# Patient Record
Sex: Male | Born: 2000 | Race: Black or African American | Hispanic: No | Marital: Single | Smoking: Never smoker
Health system: Southern US, Community
[De-identification: ages and names within clinical notes are randomized; demographics above are authoritative.]

## PROBLEM LIST (undated history)

## (undated) DIAGNOSIS — J45909 Unspecified asthma, uncomplicated: Secondary | ICD-10-CM

## (undated) DIAGNOSIS — I10 Essential (primary) hypertension: Secondary | ICD-10-CM

## (undated) DIAGNOSIS — D571 Sickle-cell disease without crisis: Secondary | ICD-10-CM

## (undated) DIAGNOSIS — M87059 Idiopathic aseptic necrosis of unspecified femur: Secondary | ICD-10-CM

## (undated) HISTORY — PX: HIP SURGERY: SHX245

## (undated) HISTORY — PX: SPLENECTOMY: SUR1306

## (undated) HISTORY — PX: SPLENECTOMY, TOTAL: SHX788

---
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
## 2019-01-07 ENCOUNTER — Encounter (HOSPITAL_BASED_OUTPATIENT_CLINIC_OR_DEPARTMENT_OTHER): Payer: Self-pay | Admitting: *Deleted

## 2019-01-07 ENCOUNTER — Other Ambulatory Visit: Payer: Self-pay

## 2019-01-07 ENCOUNTER — Emergency Department (HOSPITAL_BASED_OUTPATIENT_CLINIC_OR_DEPARTMENT_OTHER)
Admission: EM | Admit: 2019-01-07 | Discharge: 2019-01-07 | Disposition: A | Discharge status: Home / Self Care | Payer: BC Managed Care – PPO | Attending: Emergency Medicine | Admitting: Emergency Medicine

## 2019-01-07 DIAGNOSIS — J45909 Unspecified asthma, uncomplicated: Secondary | ICD-10-CM | POA: Diagnosis not present

## 2019-01-07 DIAGNOSIS — Z7722 Contact with and (suspected) exposure to environmental tobacco smoke (acute) (chronic): Secondary | ICD-10-CM | POA: Diagnosis not present

## 2019-01-07 DIAGNOSIS — M25569 Pain in unspecified knee: Secondary | ICD-10-CM | POA: Diagnosis present

## 2019-01-07 DIAGNOSIS — D5700 Hb-SS disease with crisis, unspecified: Secondary | ICD-10-CM | POA: Diagnosis not present

## 2019-01-07 DIAGNOSIS — I10 Essential (primary) hypertension: Secondary | ICD-10-CM | POA: Insufficient documentation

## 2019-01-07 LAB — CBC
HCT: 30.6 % — ABNORMAL LOW (ref 39.0–52.0)
Hemoglobin: 10.3 g/dL — ABNORMAL LOW (ref 13.0–17.0)
MCH: 24.5 pg — ABNORMAL LOW (ref 26.0–34.0)
MCHC: 33.7 g/dL (ref 30.0–36.0)
MCV: 72.9 fL — ABNORMAL LOW (ref 80.0–100.0)
Platelets: 554 10*3/uL — ABNORMAL HIGH (ref 150–400)
RBC: 4.2 MIL/uL — ABNORMAL LOW (ref 4.22–5.81)
RDW: 23.6 % — ABNORMAL HIGH (ref 11.5–15.5)
WBC: 10.4 10*3/uL (ref 4.0–10.5)
nRBC: 0.6 % — ABNORMAL HIGH (ref 0.0–0.2)

## 2019-01-07 LAB — RETICULOCYTES
Immature Retic Fract: 36.7 % — ABNORMAL HIGH (ref 2.3–15.9)
RBC.: 4.12 MIL/uL — ABNORMAL LOW (ref 4.22–5.81)
Retic Count, Absolute: 265.5 10*3/uL — ABNORMAL HIGH (ref 19.0–186.0)
Retic Ct Pct: 6.1 % — ABNORMAL HIGH (ref 0.4–3.1)

## 2019-01-07 LAB — COMPREHENSIVE METABOLIC PANEL
ALT: 13 U/L (ref 0–44)
AST: 21 U/L (ref 15–41)
Albumin: 4 g/dL (ref 3.5–5.0)
Alkaline Phosphatase: 82 U/L (ref 38–126)
Anion gap: 8 (ref 5–15)
BUN: 10 mg/dL (ref 6–20)
CO2: 24 mmol/L (ref 22–32)
Calcium: 8.8 mg/dL — ABNORMAL LOW (ref 8.9–10.3)
Chloride: 104 mmol/L (ref 98–111)
Creatinine, Ser: 0.57 mg/dL — ABNORMAL LOW (ref 0.61–1.24)
GFR calc Af Amer: >60 mL/min (ref >60)
GFR calc non Af Amer: >60 mL/min (ref >60)
Glucose, Bld: 95 mg/dL (ref 70–99)
Potassium: 4 mmol/L (ref 3.5–5.1)
Sodium: 136 mmol/L (ref 135–145)
Total Bilirubin: 1.7 mg/dL — ABNORMAL HIGH (ref 0.3–1.2)
Total Protein: 7.5 g/dL (ref 6.5–8.1)

## 2019-01-07 MED ORDER — KETOROLAC TROMETHAMINE 15 MG/ML IJ SOLN
15.0000 mg | Freq: Once | INTRAMUSCULAR | Status: AC
  Administered 2019-01-07: 15:00:00 15 mg via INTRAVENOUS
  Filled 2019-01-07: qty 1

## 2019-01-07 MED ORDER — HYDROMORPHONE HCL 1 MG/ML IJ SOLN
1.0000 mg | Freq: Once | INTRAMUSCULAR | Status: AC
  Administered 2019-01-07: 13:00:00 1 mg via INTRAVENOUS
  Filled 2019-01-07: qty 1

## 2019-01-07 MED ORDER — HYDROMORPHONE HCL 1 MG/ML IJ SOLN
1.0000 mg | Freq: Once | INTRAMUSCULAR | Status: AC
  Administered 2019-01-07: 15:00:00 1 mg via INTRAVENOUS
  Filled 2019-01-07: qty 1

## 2019-01-07 MED ORDER — HYDROMORPHONE HCL 1 MG/ML IJ SOLN
1.0000 mg | Freq: Once | INTRAMUSCULAR | Status: AC
  Administered 2019-01-07: 1 mg via INTRAVENOUS
  Filled 2019-01-07: qty 1

## 2019-01-07 MED ORDER — SODIUM CHLORIDE 0.9 % IV BOLUS
1000.0000 mL | Freq: Once | INTRAVENOUS | Status: AC
  Administered 2019-01-07: 1000 mL via INTRAVENOUS

## 2019-01-07 NOTE — ED Triage Notes (Signed)
Left knee pain since yesterday. States he is having sickle cell pain. He went to his MD yesterday and left without being seen. States he did not feel his MD was giving him proper care. He is ambulatory.

## 2019-01-07 NOTE — ED Provider Notes (Signed)
[facility] Emergency Department Provider Note MRN:  [id number]  Arrival date & time: 01/07/19     Chief Complaint   Knee Pain and Sickle Cell Pain Crisis   History of Present Illness   Mark Blake is a 18 y.o. year-old male with a history of sickle cell disease presenting to the ED with chief complaint of sickle cell pain crisis.  Pain began yesterday at 1:30 AM.  Sudden onset, constant since that time.  Pain is 8 out of 10 in severity.  No trauma, somewhat worsened with motion.  Denies fever, no rash, no chest pain, shortness of breath, no abdominal pain.  Seems to have a flare of his sickle cell disease every October with the change of the weather.  Review of Systems  A complete 10 system review of systems was obtained and all systems are negative except as noted in the HPI and PMH.   Patient's Health History    Past Medical History:  Diagnosis Date  . Asthma   . Avascular necrosis of hip ([place])   . Hypertension   . Sickle cell anemia (HCC)     Past Surgical History:  Procedure Laterality Date  . HIP SURGERY    . SPLENECTOMY      Family History  Problem Relation Age of Onset  . Asthma Mother   . Eczema Mother   . Lupus Mother   . Migraines Mother   . Asthma Father   . Eczema Father   . Asthma Brother   . Allergic rhinitis Neg Hx   . Urticaria Neg Hx   . Immunodeficiency Neg Hx   . Atopy Neg Hx   . Angioedema Neg Hx     Social History   Socioeconomic History  . Marital status: Single    Spouse name: Not on file  . Number of children: Not on file  . Years of education: Not on file  . Highest education level: Not on file  Occupational History  . Not on file  Social Needs  . Financial resource strain: Not on file  . Food insecurity    Worry: Not on file    Inability: Not on file  . Transportation needs    Medical: Not on file    Non-medical: Not on file  Tobacco Use  . Smoking status: Passive Smoke Exposure - Never  Smoker  . Smokeless tobacco: Never Used  Substance and Sexual Activity  . Alcohol use: No  . Drug use: No  . Sexual activity: Not on file  Lifestyle  . Physical activity    Days per week: Not on file    Minutes per session: Not on file  . Stress: Not on file  Relationships  . Social Herbalist on phone: Not on file    Gets together: Not on file    Attends religious service: Not on file    Active member of club or organization: Not on file    Attends meetings of clubs or organizations: Not on file    Relationship status: Not on file  . Intimate partner violence    Fear of current or ex partner: Not on file    Emotionally abused: Not on file    Physically abused: Not on file    Forced sexual activity: Not on file  Other Topics Concern  . Not on file  Social History Narrative  . Not on file     Physical Exam  Vital Signs and  Nursing Notes reviewed Vitals:   01/07/19 1315 01/07/19 1330  BP: 127/74 137/78  Pulse: 71 82  Resp: 14 17  Temp:    SpO2: 95% 97%    CONSTITUTIONAL: Well-appearing, NAD NEURO:  Alert and oriented x 3, no focal deficits EYES:  eyes equal and reactive ENT/NECK:  no LAD, no JVD CARDIO: Regular rate, well-perfused, normal S1 and S2 PULM:  CTAB no wheezing or rhonchi GI/GU:  normal bowel sounds, non-distended, non-tender MSK/SPINE:  No gross deformities, no edema, normal range of motion of the bilateral knees, no edema, no erythema, no increased warmth to the left knee SKIN:  no rash, atraumatic PSYCH:  Appropriate speech and behavior  Diagnostic and Interventional Summary    Labs Reviewed  CBC - Abnormal; Notable for the following components:      Result Value   RBC 4.20 (*)    Hemoglobin 10.3 (*)    HCT 30.6 (*)    MCV 72.9 (*)    MCH 24.5 (*)    RDW 23.6 (*)    Platelets 554 (*)    nRBC 0.6 (*)    All other components within normal limits  COMPREHENSIVE METABOLIC PANEL - Abnormal; Notable for the following components:    Creatinine, Ser 0.57 (*)    Calcium 8.8 (*)    Total Bilirubin 1.7 (*)    All other components within normal limits  RETICULOCYTES    No orders to display    Medications  HYDROmorphone (DILAUDID) injection 1 mg (has no administration in time range)  ketorolac (TORADOL) 15 MG/ML injection 15 mg (has no administration in time range)  HYDROmorphone (DILAUDID) injection 1 mg (1 mg Intravenous Given 01/07/19 1326)  sodium chloride 0.9 % bolus 1,000 mL ( Intravenous Stopped 01/07/19 1439)  HYDROmorphone (DILAUDID) injection 1 mg (1 mg Intravenous Given 01/07/19 1440)     Procedures Critical Care  ED Course and Medical Decision Making  I have reviewed the triage vital signs and the nursing notes.  Pertinent labs & imaging results that were available during my care of the patient were reviewed by me and considered in my medical decision making (see below for details).  Favoring uncomplicated vaso-occlusive crisis related to sickle cell disease.  No evidence of DVT, nothing to suggest infection.  Will obtain basic labs, reticulocyte count, 10 pain control, anticipating discharge.  Patient with 7 out of 10 pain after the first dose of Dilaudid, down to 5 out of 10 pain after the second dose of Dilaudid.  Will provide a third dose as well as Toradol and reassess hopeful for discharge with close hematology follow-up.  Signed out to Dr. Jacqulyn BathLong at shift change.  Elmer SowMichael M. Pilar PlateBero, MD [facility] [email or web address]  Final Clinical Impressions(s) / ED Diagnoses     ICD-10-CM   1. Sickle cell pain crisis [facility](HCC)  D57.00     ED Discharge Orders    None      Discharge Instructions Discussed with and Provided to Patient:   Discharge Instructions     You were evaluated in the Emergency Department and after careful evaluation, we did not find any emergent condition requiring admission or further testing in the hospital.  Your exam/testing today is  overall reassuring.  Please return to the Emergency Department if you experience any worsening of your condition.  We encourage you to follow up with a primary care provider and your hematologist.  Thank you for allowing us to be a part of  your care.       Sabas Sous, MD 01/07/19 [phone]

## 2019-01-07 NOTE — ED Provider Notes (Signed)
Blood pressure (!) 142/79, pulse 93, temperature 98.3 F (36.8 C), temperature source Oral, resp. rate 19, height 5\' 9"  (1.753 m), weight 56.7 kg, SpO2 96 %.  Assuming care from Dr. Sedonia Small.  In short, Mark Blake is a 18 y.o. male with a chief complaint of Knee Pain and Sickle Cell Pain Crisis .  Refer to the original H&P for additional details.  The current plan of care is to f/u on pain control after additional meds.  03:51 PM Patient's pain is further downtrending at 3/10.  Plan for home medications, oral hydration at home, close sickle cell follow-up.  Discussed ED return precautions.    Margette Fast, MD 01/07/19 [phone]

## 2019-01-07 NOTE — Discharge Instructions (Signed)
You were evaluated in the Emergency Department and after careful evaluation, we did not find any emergent condition requiring admission or further testing in the hospital.  Your exam/testing today is overall reassuring.  Please return to the Emergency Department if you experience any worsening of your condition.  We encourage you to follow up with a primary care provider and your hematologist.  Thank you for allowing [place] to be a part of your care.

## 2019-02-18 ENCOUNTER — Emergency Department (HOSPITAL_COMMUNITY)
Admission: EM | Admit: 2019-02-18 | Discharge: 2019-02-18 | Disposition: A | Discharge status: Home / Self Care | Payer: BC Managed Care – PPO | Attending: Emergency Medicine | Admitting: Emergency Medicine

## 2019-02-18 ENCOUNTER — Encounter (HOSPITAL_COMMUNITY): Payer: Self-pay

## 2019-02-18 ENCOUNTER — Other Ambulatory Visit: Payer: Self-pay

## 2019-02-18 DIAGNOSIS — Z79899 Other long term (current) drug therapy: Secondary | ICD-10-CM | POA: Insufficient documentation

## 2019-02-18 DIAGNOSIS — Z7722 Contact with and (suspected) exposure to environmental tobacco smoke (acute) (chronic): Secondary | ICD-10-CM | POA: Insufficient documentation

## 2019-02-18 DIAGNOSIS — J45909 Unspecified asthma, uncomplicated: Secondary | ICD-10-CM | POA: Diagnosis not present

## 2019-02-18 DIAGNOSIS — D5700 Hb-SS disease with crisis, unspecified: Secondary | ICD-10-CM | POA: Diagnosis not present

## 2019-02-18 DIAGNOSIS — M25532 Pain in left wrist: Secondary | ICD-10-CM | POA: Diagnosis present

## 2019-02-18 DIAGNOSIS — I10 Essential (primary) hypertension: Secondary | ICD-10-CM | POA: Diagnosis not present

## 2019-02-18 LAB — CBC WITH DIFFERENTIAL/PLATELET
Abs Immature Granulocytes: 0.05 10*3/uL (ref 0.00–0.07)
Basophils Absolute: 0.1 10*3/uL (ref 0.0–0.1)
Basophils Relative: 1 %
Eosinophils Absolute: 1.3 10*3/uL — ABNORMAL HIGH (ref 0.0–0.5)
Eosinophils Relative: 12 %
HCT: 31.3 % — ABNORMAL LOW (ref 39.0–52.0)
Hemoglobin: 10.3 g/dL — ABNORMAL LOW (ref 13.0–17.0)
Immature Granulocytes: 1 %
Lymphocytes Relative: 30 %
Lymphs Abs: 3.1 10*3/uL (ref 0.7–4.0)
MCH: 23.3 pg — ABNORMAL LOW (ref 26.0–34.0)
MCHC: 32.9 g/dL (ref 30.0–36.0)
MCV: 70.7 fL — ABNORMAL LOW (ref 80.0–100.0)
Monocytes Absolute: 0.9 10*3/uL (ref 0.1–1.0)
Monocytes Relative: 8 %
Neutro Abs: 5.1 10*3/uL (ref 1.7–7.7)
Neutrophils Relative %: 48 %
Platelets: 623 10*3/uL — ABNORMAL HIGH (ref 150–400)
RBC: 4.43 MIL/uL (ref 4.22–5.81)
RDW: 23.4 % — ABNORMAL HIGH (ref 11.5–15.5)
WBC: 10.4 10*3/uL (ref 4.0–10.5)
nRBC: 0.4 % — ABNORMAL HIGH (ref 0.0–0.2)

## 2019-02-18 LAB — RETICULOCYTES
Immature Retic Fract: 29.8 % — ABNORMAL HIGH (ref 2.3–15.9)
RBC.: 4.43 MIL/uL (ref 4.22–5.81)
Retic Count, Absolute: 324 10*3/uL — ABNORMAL HIGH (ref 19.0–186.0)
Retic Ct Pct: 7.7 % — ABNORMAL HIGH (ref 0.4–3.1)

## 2019-02-18 LAB — COMPREHENSIVE METABOLIC PANEL
ALT: 28 U/L (ref 0–44)
AST: 27 U/L (ref 15–41)
Albumin: 4.1 g/dL (ref 3.5–5.0)
Alkaline Phosphatase: 87 U/L (ref 38–126)
Anion gap: 9 (ref 5–15)
BUN: 7 mg/dL (ref 6–20)
CO2: 25 mmol/L (ref 22–32)
Calcium: 8.9 mg/dL (ref 8.9–10.3)
Chloride: 103 mmol/L (ref 98–111)
Creatinine, Ser: 0.58 mg/dL — ABNORMAL LOW (ref 0.61–1.24)
GFR calc Af Amer: >60 mL/min (ref >60)
GFR calc non Af Amer: >60 mL/min (ref >60)
Glucose, Bld: 82 mg/dL (ref 70–99)
Potassium: 3.9 mmol/L (ref 3.5–5.1)
Sodium: 137 mmol/L (ref 135–145)
Total Bilirubin: 1.3 mg/dL — ABNORMAL HIGH (ref 0.3–1.2)
Total Protein: 8 g/dL (ref 6.5–8.1)

## 2019-02-18 MED ORDER — ONDANSETRON HCL 4 MG/2ML IJ SOLN
4.0000 mg | INTRAMUSCULAR | Status: DC | PRN
  Filled 2019-02-18: qty 2

## 2019-02-18 MED ORDER — HYDROMORPHONE HCL 1 MG/ML IJ SOLN
1.0000 mg | INTRAMUSCULAR | Status: AC
  Filled 2019-02-18: qty 1

## 2019-02-18 MED ORDER — HYDROMORPHONE HCL 1 MG/ML IJ SOLN: 0.5000 mg | INTRAMUSCULAR | Status: AC

## 2019-02-18 MED ORDER — HYDROMORPHONE HCL 1 MG/ML IJ SOLN: 1.0000 mg | INTRAMUSCULAR | Status: AC

## 2019-02-18 MED ORDER — DEXTROSE-NACL 5-0.45 % IV SOLN
INTRAVENOUS | Status: DC
  Administered 2019-02-18: 20:00:00 via INTRAVENOUS

## 2019-02-18 MED ORDER — KETOROLAC TROMETHAMINE 30 MG/ML IJ SOLN
30.0000 mg | INTRAMUSCULAR | Status: AC
  Administered 2019-02-18: 30 mg via INTRAVENOUS
  Filled 2019-02-18: qty 1

## 2019-02-18 MED ORDER — HYDROMORPHONE HCL 1 MG/ML IJ SOLN
0.5000 mg | INTRAMUSCULAR | Status: AC
  Administered 2019-02-18: 20:00:00 0.5 mg via INTRAVENOUS
  Filled 2019-02-18: qty 1

## 2019-02-18 MED ORDER — SODIUM CHLORIDE 0.9% FLUSH: 3.0000 mL | Freq: Once | INTRAVENOUS | Status: DC

## 2019-02-18 NOTE — ED Provider Notes (Signed)
[facility] Note   CSN: [id number] Arrival date & time: 02/18/19  1739     History   Chief Complaint Chief Complaint  Patient presents with  . Sickle Cell Pain Crisis    HPI Mark Blake is a 18 y.o. male past medical history significant for sickle cell anemia, asthma, splenic sequestration status post splenectomy, secondary hypertension, priapism presents to emergency department today with chief complaint of sickle cell pain crisis.  Pain is located in his left wrist.  He states this feels like his typical sickle cell pain.  He states pain started at 11 AM this morning.  He tried managing with his home medications however reports symptoms did not improve.  He reports weather changes typical trigger for his pain, consistent with recent onset cold weather.  He denies any fever, chest pain, shortness of breath, abdominal pain, URI symptoms, priapism. History provided by patient with additional history obtained from chart review.       Past Medical History:  Diagnosis Date  . Asthma   . Avascular necrosis of hip (HCC)   . Hypertension   . Sickle cell anemia [facility])     Patient Active Problem List   Diagnosis Date Noted  . Moderate persistent asthma 08/09/2016  . Seasonal and perennial allergic rhinitis 08/09/2016  . Allergic conjunctivitis 08/09/2016  . Atopic dermatitis 08/09/2016    Past Surgical History:  Procedure Laterality Date  . HIP SURGERY    . SPLENECTOMY          Home Medications    Prior to Admission medications   Medication Sig Start Date End Date Taking? Authorizing Provider  albuterol (PROVENTIL HFA;VENTOLIN HFA) 108 (90 Base) MCG/ACT inhaler Inhale 1-2 puffs into the lungs every 4 (four) hours as needed for wheezing or shortness of breath. 08/09/16  Yes Bobbitt, Heywood Iles, MD  cefadroxil (DURICEF) 500 MG capsule Take 500 mg by mouth 2 (two) times daily.   Yes [provider]  desonide (DESOWEN)  0.05 % ointment Apply sparingly twice daily as needed to affected areas on face or neck 08/09/16  Yes Bobbitt, Heywood Iles, MD  fluticasone [facility]) 50 MCG/ACT nasal spray Place 2 sprays into both nostrils daily as needed for allergies or rhinitis. 08/09/16  Yes Bobbitt, Heywood Iles, MD  fluticasone (FLOVENT HFA) 110 MCG/ACT inhaler Inhale 2 puffs into the lungs 2 (two) times daily. Use with spacer device. 08/09/16  Yes Bobbitt, Heywood Iles, MD  folic acid (FOLVITE) 1 MG tablet Take 1 mg by mouth daily.   Yes [provider]  hydroxyurea (HYDREA) 500 MG capsule Take 500 mg by mouth daily. May take with food to minimize GI side effects.   Yes [provider]  hydrOXYzine (ATARAX/VISTARIL) 25 MG tablet Take 25 mg by mouth every 6 (six) hours as needed for itching. 07/10/17  Yes [provider]  ibuprofen (ADVIL,MOTRIN) 400 MG tablet Take 400 mg by mouth every 6 (six) hours as needed.   Yes [provider]  levocetirizine (XYZAL) 5 MG tablet Take 1 tablet (5 mg total) by mouth daily as needed for allergies. 08/09/16  Yes Bobbitt, Heywood Iles, MD  lisinopril (PRINIVIL,ZESTRIL) 2.5 MG tablet Take 2.5 mg by mouth daily.    Yes [provider]  montelukast (SINGULAIR) 5 MG chewable tablet Chew 5 mg by mouth at bedtime.   Yes [provider]  Olopatadine HCl (PATADAY) 0.2 % SOLN Place 1 drop into both eyes daily as needed. 08/11/16  Yes  Marcelyn BruinsPadgett, Shaylar Patricia, MD  oxyCODONE (OXY IR/ROXICODONE) 5 MG immediate release tablet Take 5 mg by mouth every 4 (four) hours as needed for pain.    Yes [provider]  penicillin v potassium (VEETID) 250 MG tablet Take 250 mg by mouth daily.    Yes [provider]  olopatadine (PATANOL) 0.1 % ophthalmic solution Place 1 drop into both eyes 2 (two) times daily as needed for allergies. Patient not taking: Reported on 02/18/2019 08/17/16   Bobbitt, Heywood Ilesalph Carter, MD  OxyCODONE HCl, Abuse Deter, (OXAYDO) 5  MG TABA Take 5 mg by mouth every 4 (four) hours as needed (moderate to severe pain). Patient not taking: Reported on 02/18/2019 09/19/14   Gwyneth SproutPlunkett, Whitney, MD    Family History Family History  Problem Relation Age of Onset  . Asthma Mother   . Eczema Mother   . Lupus Mother   . Migraines Mother   . Asthma Father   . Eczema Father   . Asthma Brother   . Allergic rhinitis Neg Hx   . Urticaria Neg Hx   . Immunodeficiency Neg Hx   . Atopy Neg Hx   . Angioedema Neg Hx     Social History Social History   Tobacco Use  . Smoking status: Passive Smoke Exposure - Never Smoker  . Smokeless tobacco: Never Used  Substance Use Topics  . Alcohol use: No  . Drug use: No     Allergies   Pork allergy   Review of Systems Review of Systems All other systems are reviewed and are negative for acute change except as noted in the HPI.   Physical Exam Updated Vital Signs BP 129/66   Pulse 85   Temp 98.9 F (37.2 C) (Oral)   Resp 16   Ht 5\' 9"  (1.753 m)   Wt 59 kg   SpO2 97%   BMI 19.20 kg/m   Physical Exam Vitals signs and nursing note reviewed.  Constitutional:      General: He is not in acute distress.    Appearance: He is not ill-appearing.  HENT:     Head: Normocephalic and atraumatic.     Right Ear: Tympanic membrane and external ear normal.     Left Ear: Tympanic membrane and external ear normal.     Nose: Nose normal.     Mouth/Throat:     Mouth: Mucous membranes are moist.     Pharynx: Oropharynx is clear.  Eyes:     General: No scleral icterus.       Right eye: No discharge.        Left eye: No discharge.     Extraocular Movements: Extraocular movements intact.     Conjunctiva/sclera: Conjunctivae normal.     Pupils: Pupils are equal, round, and reactive to light.  Neck:     Musculoskeletal: Normal range of motion.     Vascular: No JVD.  Cardiovascular:     Rate and Rhythm: Normal rate and regular rhythm.     Pulses: Normal pulses.          Radial  pulses are 2+ on the right side and 2+ on the left side.     Heart sounds: Normal heart sounds.  Pulmonary:     Comments: Lungs clear to auscultation in all fields. Symmetric chest rise. No wheezing, rales, or rhonchi. Abdominal:     Comments: Abdomen is soft, non-distended, and non-tender in all quadrants. No rigidity, no guarding. No peritoneal signs.  Musculoskeletal: Normal range  of motion.     Comments: Full range of motion of left wrist.  No signs of trauma or injury. Neurovascularly intact distally. Compartments soft above and below affected joint.    Skin:    General: Skin is warm and dry.     Capillary Refill: Capillary refill takes less than 2 seconds.  Neurological:     Mental Status: He is oriented to person, place, and time.     GCS: GCS eye subscore is 4. GCS verbal subscore is 5. GCS motor subscore is 6.     Comments: Fluent speech, no facial droop.  Psychiatric:        Behavior: Behavior normal.      ED Treatments / Results  Labs (all labs ordered are listed, but only abnormal results are displayed) Labs Reviewed  COMPREHENSIVE METABOLIC PANEL - Abnormal; Notable for the following components:      Result Value   Creatinine, Ser 0.58 (*)    Total Bilirubin 1.3 (*)    All other components within normal limits  CBC WITH DIFFERENTIAL/PLATELET - Abnormal; Notable for the following components:   Hemoglobin 10.3 (*)    HCT 31.3 (*)    MCV 70.7 (*)    MCH 23.3 (*)    RDW 23.4 (*)    Platelets 623 (*)    nRBC 0.4 (*)    Eosinophils Absolute 1.3 (*)    All other components within normal limits  RETICULOCYTES - Abnormal; Notable for the following components:   Retic Ct Pct 7.7 (*)    Retic Count, Absolute 324.0 (*)    Immature Retic Fract 29.8 (*)    All other components within normal limits    EKG None  Radiology No results found.  Procedures Procedures (including critical care time)  Medications Ordered in ED Medications  sodium chloride flush (NS)  0.9 % injection 3 mL (has no administration in time range)  dextrose 5 %-0.45 % sodium chloride infusion ( Intravenous New Bag/Given 02/18/19 2010)  HYDROmorphone (DILAUDID) injection 1 mg (has no administration in time range)    Or  HYDROmorphone (DILAUDID) injection 1 mg (has no administration in time range)  HYDROmorphone (DILAUDID) injection 1 mg (has no administration in time range)    Or  HYDROmorphone (DILAUDID) injection 1 mg (has no administration in time range)  ondansetron (ZOFRAN) injection 4 mg (has no administration in time range)  ketorolac (TORADOL) 30 MG/ML injection 30 mg (30 mg Intravenous Given 02/18/19 2008)  HYDROmorphone (DILAUDID) injection 0.5 mg (0.5 mg Intravenous Given 02/18/19 2010)    Or  HYDROmorphone (DILAUDID) injection 0.5 mg ( Subcutaneous See Alternative 02/18/19 2010)     Initial Impression / Assessment and Plan / ED Course  I have reviewed the triage vital signs and the nursing notes.  Pertinent labs & imaging results that were available during my care of the patient were reviewed by me and considered in my medical decision making (see chart for details).  18 y.o. male with PMH/o Sickle Cell anemia who presents for evaluation of sickle cell related pain. Patient is afebrile, non-toxic appearing, sitting comfortably on examination table. Vital signs reviewed and stable.  Will give D5 1/2 NS and analgesics. Initials labs ordered.   Labs reviewed and appear close to his baseline.  No concern for infectious etiology today, no signs or symptoms of acute chest syndrome.  Very low suspicion for for septic joint. Patient given 1 dose of 0.5 mg of Dilaudid and Toradol.  On reassessment he states  pain has significantly improved, stating pain is only 1 of 10 in severity now.  He feels as if he can manage his symptoms at home.  The patient appears reasonably screened and/or stabilized for discharge and I doubt any other medical condition or other [facility] requiring further  screening, evaluation, or treatment in the ED at this time prior to discharge. The patient is safe for discharge with strict return precautions discussed. Recommend hematology follow up.   Portions of this note were generated with [employer]. Dictation errors may occur despite best attempts at proofreading.  Final Clinical Impressions(s) / ED Diagnoses   Final diagnoses:  Sickle cell anemia with pain [facility])    ED Discharge Orders    None       Flint Melter 02/18/19 2147    Margette Fast, MD 02/19/19 1358

## 2019-02-18 NOTE — Discharge Instructions (Addendum)
You have been seen today for sickle cell pain. Please read and follow all provided instructions. Return to the emergency room for worsening condition or new concerning symptoms.    1. Medications:  Continue usual home medications Take medications as prescribed. Please review all of the medicines and only take them if you do not have an allergy to them.   2. Treatment: rest, drink plenty of fluids  3. Follow Up: These follow-up with your hematologist soon as possible to discuss recent sickle cell crisis.   It is also a possibility that you have an allergic reaction to any of the medicines that you have been prescribed - Everybody reacts differently to medications and while MOST people have no trouble with most medicines, you may have a reaction such as nausea, vomiting, rash, swelling, shortness of breath. If this is the case, please stop taking the medicine immediately and contact your physician.  ?

## 2019-02-18 NOTE — ED Triage Notes (Signed)
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  C/O left arm pain   7/10 sharp pain that radiates down to wrist.   Patient reports same usually pain for his SCC.   A/ox4 Ambulatory in triage.

## 2019-03-31 ENCOUNTER — Encounter: Payer: Self-pay | Admitting: Pediatrics

## 2019-03-31 ENCOUNTER — Ambulatory Visit (INDEPENDENT_AMBULATORY_CARE_PROVIDER_SITE_OTHER): Payer: BC Managed Care – PPO | Admitting: Pediatrics

## 2019-03-31 ENCOUNTER — Other Ambulatory Visit: Payer: Self-pay

## 2019-03-31 VITALS — BP 110/70 | HR 88 | Temp 97.0°F | Resp 16 | Ht 68.5 in | Wt 125.0 lb

## 2019-03-31 DIAGNOSIS — J4530 Mild persistent asthma, uncomplicated: Secondary | ICD-10-CM | POA: Diagnosis not present

## 2019-03-31 DIAGNOSIS — Z96643 Presence of artificial hip joint, bilateral: Secondary | ICD-10-CM

## 2019-03-31 DIAGNOSIS — M87052 Idiopathic aseptic necrosis of left femur: Secondary | ICD-10-CM

## 2019-03-31 DIAGNOSIS — M87051 Idiopathic aseptic necrosis of right femur: Secondary | ICD-10-CM

## 2019-03-31 DIAGNOSIS — J4541 Moderate persistent asthma with (acute) exacerbation: Secondary | ICD-10-CM

## 2019-03-31 DIAGNOSIS — J3089 Other allergic rhinitis: Secondary | ICD-10-CM | POA: Diagnosis not present

## 2019-03-31 DIAGNOSIS — H1013 Acute atopic conjunctivitis, bilateral: Secondary | ICD-10-CM

## 2019-03-31 DIAGNOSIS — L2089 Other atopic dermatitis: Secondary | ICD-10-CM

## 2019-03-31 DIAGNOSIS — D571 Sickle-cell disease without crisis: Secondary | ICD-10-CM

## 2019-03-31 MED ORDER — LEVOCETIRIZINE DIHYDROCHLORIDE 5 MG PO TABS: 5.0000 mg | ORAL_TABLET | Freq: Every day | ORAL | 5 refills | Status: AC

## 2019-03-31 MED ORDER — MONTELUKAST SODIUM 10 MG PO TABS: 10.0000 mg | ORAL_TABLET | Freq: Every day | ORAL | 5 refills | Status: AC

## 2019-03-31 MED ORDER — FLUTICASONE PROPIONATE 50 MCG/ACT NA SUSP: 2.0000 | Freq: Every day | NASAL | 5 refills | Status: AC | PRN

## 2019-03-31 MED ORDER — ALBUTEROL SULFATE HFA 108 (90 BASE) MCG/ACT IN AERS: 1.0000 | INHALATION_SPRAY | RESPIRATORY_TRACT | 1 refills | Status: AC | PRN

## 2019-03-31 MED ORDER — OLOPATADINE HCL 0.2 % OP SOLN: 1.0000 [drp] | Freq: Every day | OPHTHALMIC | 5 refills | Status: AC | PRN
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
## 2019-03-31 NOTE — Patient Instructions (Addendum)
Levocetirizine 5 mg-take 1 tablet once a day for runny nose or itchy eyes Fluticasone 2 sprays per nostril once a day if needed for stuffy nose Pataday -1 drop 3 times a day if needed for itchy eyes  Montelukast 10 mg-take 1 tablet once a day to prevent coughing or wheezing ProAir 2 puffs every 4 hours if needed for wheezing or coughing spells.  You may use ProAir 2 puffs 5 to 15 minutes before exercise  Lotrimin 1% cream twice a day for 2 weeks in one of the new skin rashes.  If you do improve , use the Lotrimin on  the other similar areas which may be a skin fungal infection , for 2 weeks Continue on the treatment plan of your eczema prescribed by your dermatologist See your dermatologist if you do not improve with the use of Lotrimin  Continue on your other medications

## 2019-05-18 ENCOUNTER — Other Ambulatory Visit: Payer: Self-pay

## 2019-05-18 ENCOUNTER — Encounter (HOSPITAL_BASED_OUTPATIENT_CLINIC_OR_DEPARTMENT_OTHER): Payer: Self-pay

## 2019-05-18 ENCOUNTER — Emergency Department (HOSPITAL_BASED_OUTPATIENT_CLINIC_OR_DEPARTMENT_OTHER)
Admission: EM | Admit: 2019-05-18 | Discharge: 2019-05-18 | Disposition: A | Discharge status: Home / Self Care | Payer: BC Managed Care – PPO | Attending: Emergency Medicine | Admitting: Emergency Medicine

## 2019-05-18 DIAGNOSIS — Z79899 Other long term (current) drug therapy: Secondary | ICD-10-CM | POA: Diagnosis not present

## 2019-05-18 DIAGNOSIS — J4540 Moderate persistent asthma, uncomplicated: Secondary | ICD-10-CM | POA: Diagnosis not present

## 2019-05-18 DIAGNOSIS — D5700 Hb-SS disease with crisis, unspecified: Secondary | ICD-10-CM

## 2019-05-18 DIAGNOSIS — I10 Essential (primary) hypertension: Secondary | ICD-10-CM | POA: Insufficient documentation

## 2019-05-18 DIAGNOSIS — J45909 Unspecified asthma, uncomplicated: Secondary | ICD-10-CM | POA: Insufficient documentation

## 2019-05-18 DIAGNOSIS — D57219 Sickle-cell/Hb-C disease with crisis, unspecified: Secondary | ICD-10-CM | POA: Insufficient documentation

## 2019-05-18 DIAGNOSIS — M25561 Pain in right knee: Secondary | ICD-10-CM | POA: Diagnosis present

## 2019-05-18 DIAGNOSIS — M25562 Pain in left knee: Secondary | ICD-10-CM

## 2019-05-18 LAB — CBC WITH DIFFERENTIAL/PLATELET
Abs Immature Granulocytes: 0.15 10*3/uL — ABNORMAL HIGH (ref 0.00–0.07)
Basophils Absolute: 0.1 10*3/uL (ref 0.0–0.1)
Basophils Relative: 1 %
Eosinophils Absolute: 0.1 10*3/uL (ref 0.0–0.5)
Eosinophils Relative: 1 %
HCT: 31.5 % — ABNORMAL LOW (ref 39.0–52.0)
Hemoglobin: 10.5 g/dL — ABNORMAL LOW (ref 13.0–17.0)
Immature Granulocytes: 1 %
Lymphocytes Relative: 8 %
Lymphs Abs: 1.1 10*3/uL (ref 0.7–4.0)
MCH: 23.4 pg — ABNORMAL LOW (ref 26.0–34.0)
MCHC: 33.3 g/dL (ref 30.0–36.0)
MCV: 70.3 fL — ABNORMAL LOW (ref 80.0–100.0)
Monocytes Absolute: 0.7 10*3/uL (ref 0.1–1.0)
Monocytes Relative: 5 %
Neutro Abs: 11.4 10*3/uL — ABNORMAL HIGH (ref 1.7–7.7)
Neutrophils Relative %: 84 %
Platelets: 587 10*3/uL — ABNORMAL HIGH (ref 150–400)
RBC: 4.48 MIL/uL (ref 4.22–5.81)
RDW: 22.5 % — ABNORMAL HIGH (ref 11.5–15.5)
WBC: 13.6 10*3/uL — ABNORMAL HIGH (ref 4.0–10.5)
nRBC: 0.2 % (ref 0.0–0.2)

## 2019-05-18 LAB — COMPREHENSIVE METABOLIC PANEL
ALT: 29 U/L (ref 0–44)
AST: 29 U/L (ref 15–41)
Albumin: 4.2 g/dL (ref 3.5–5.0)
Alkaline Phosphatase: 91 U/L (ref 38–126)
Anion gap: 8 (ref 5–15)
BUN: 9 mg/dL (ref 6–20)
CO2: 26 mmol/L (ref 22–32)
Calcium: 9.3 mg/dL (ref 8.9–10.3)
Chloride: 101 mmol/L (ref 98–111)
Creatinine, Ser: 0.57 mg/dL — ABNORMAL LOW (ref 0.61–1.24)
GFR calc Af Amer: >60 mL/min (ref >60)
GFR calc non Af Amer: >60 mL/min (ref >60)
Glucose, Bld: 119 mg/dL — ABNORMAL HIGH (ref 70–99)
Potassium: 4.1 mmol/L (ref 3.5–5.1)
Sodium: 135 mmol/L (ref 135–145)
Total Bilirubin: 1.4 mg/dL — ABNORMAL HIGH (ref 0.3–1.2)
Total Protein: 7.9 g/dL (ref 6.5–8.1)

## 2019-05-18 LAB — RETICULOCYTES
Immature Retic Fract: 34.7 % — ABNORMAL HIGH (ref 2.3–15.9)
RBC.: 4.3 MIL/uL (ref 4.22–5.81)
Retic Count, Absolute: 235 10*3/uL — ABNORMAL HIGH (ref 19.0–186.0)
Retic Ct Pct: 5.5 % — ABNORMAL HIGH (ref 0.4–3.1)

## 2019-05-18 MED ORDER — DEXTROSE-NACL 5-0.45 % IV SOLN: INTRAVENOUS | Status: DC

## 2019-05-18 MED ORDER — HYDROMORPHONE HCL 1 MG/ML IJ SOLN
0.5000 mg | Freq: Once | INTRAMUSCULAR | Status: AC
  Administered 2019-05-18: 0.5 mg via INTRAVENOUS
  Filled 2019-05-18: qty 1

## 2019-05-18 MED ORDER — KETOROLAC TROMETHAMINE 15 MG/ML IJ SOLN
15.0000 mg | INTRAMUSCULAR | Status: AC
  Administered 2019-05-18: 09:00:00 15 mg via INTRAVENOUS
  Filled 2019-05-18: qty 1

## 2019-05-18 MED ORDER — HYDROMORPHONE HCL 1 MG/ML IJ SOLN: 1.0000 mg | INTRAMUSCULAR | Status: DC

## 2019-05-18 MED ORDER — HYDROMORPHONE HCL 1 MG/ML IJ SOLN
0.5000 mg | INTRAMUSCULAR | Status: AC
  Administered 2019-05-18: 09:00:00 0.5 mg via INTRAVENOUS
  Filled 2019-05-18: qty 1

## 2019-05-18 NOTE — ED Notes (Signed)
ED Provider at bedside. 

## 2019-05-18 NOTE — ED Provider Notes (Signed)
[facility] EMERGENCY DEPARTMENT Provider Note   CSN: [id number] Arrival date & time: 05/18/19  0754     History Chief Complaint  Patient presents with  . Knee Pain    Mark Blake is a 19 y.o. male.  HPI Patient reports that he awakened with knee pain at about 1 AM.  His right knee started to ache.  He reports it was fairly typical of sickle cell pain.  It was not too bad so he tried going back to sleep.  At 2 AM it had gotten much worse.  He tried 1 oxycodone and a Epson salt soak.  There was some relief but the pain continued to worsen.  Is a deep aching pain within the right knee.  No swelling.  No injury.  Patient reports he did have a sickle cell pain crisis in that knee once before.  He reports he is otherwise been well.  He has not had fevers, chills, nausea, vomiting, chest pain, shortness of breath, cough, abdominal pain.  All symptoms started during the night last night.  No calf pain.  No numbness weakness or tingling of the foot or lower leg.    Past Medical History:  Diagnosis Date  . Asthma   . Avascular necrosis of hip (HCC)   . Hypertension   . Sickle cell anemia [facility])     Patient Active Problem List   Diagnosis Date Noted  . Moderate persistent asthma 08/09/2016  . Seasonal and perennial allergic rhinitis 08/09/2016  . Allergic conjunctivitis 08/09/2016  . Atopic dermatitis 08/09/2016    Past Surgical History:  Procedure Laterality Date  . HIP SURGERY    . SPLENECTOMY         Family History  Problem Relation Age of Onset  . Asthma Mother   . Eczema Mother   . Lupus Mother   . Migraines Mother   . Asthma Father   . Eczema Father   . Asthma Brother   . Allergic rhinitis Neg Hx   . Urticaria Neg Hx   . Immunodeficiency Neg Hx   . Atopy Neg Hx   . Angioedema Neg Hx     Social History   Tobacco Use  . Smoking status: Passive Smoke Exposure - Never Smoker  . Smokeless tobacco: Never Used  Substance Use Topics  . Alcohol use: No   . Drug use: No    Home Medications Prior to Admission medications   Medication Sig Start Date End Date Taking? Authorizing Provider  albuterol (VENTOLIN HFA) 108 (90 Base) MCG/ACT inhaler Inhale 1-2 puffs into the lungs every 4 (four) hours as needed for wheezing (coughing spells). 03/31/19   Fletcher Anon, MD  cefadroxil (DURICEF) 500 MG capsule Take 500 mg by mouth 2 (two) times daily.    [provider]  clindamycin (CLEOCIN T) 1 % external solution Apply topically 2 (two) times daily.    [provider]  fluticasone (FLONASE) 50 MCG/ACT nasal spray Place 2 sprays into both nostrils daily as needed (stuffy nose). 03/31/19   Fletcher Anon, MD  hydroxyurea (HYDREA) 500 MG capsule Take 500 mg by mouth daily. May take with food to minimize GI side effects.    [provider]  ibuprofen (ADVIL,MOTRIN) 400 MG tablet Take 400 mg by mouth every 6 (six) hours as needed.    [provider]  levocetirizine (XYZAL) 5 MG tablet Take 1 tablet (5 mg total) by mouth daily. For runny nose or itchy eyes 03/31/19  Fletcher Anon, MD  loratadine (CLARITIN) 10 MG tablet Take 10 mg by mouth daily.    [provider]  montelukast (SINGULAIR) 10 MG tablet Take 1 tablet (10 mg total) by mouth at bedtime. To prevent coughing or wheezing. 03/31/19   Fletcher Anon, MD  Olopatadine HCl (PATADAY) 0.2 % SOLN Place 1 drop into both eyes daily as needed (itchy eyes). 03/31/19   Fletcher Anon, MD  oxyCODONE (OXY IR/ROXICODONE) 5 MG immediate release tablet Take 5 mg by mouth every 4 (four) hours as needed for pain.     [provider]  penicillin v potassium (VEETID) 250 MG tablet Take 250 mg by mouth daily.     [provider]    Allergies    Pork allergy  Review of Systems   Review of Systems 10 Systems reviewed and are negative for acute change except as noted in the HPI.  Physical Exam Updated Vital Signs BP (!) 122/51 (BP Location: Left  Arm)   Pulse 88   Temp 98.5 F (36.9 C) (Oral)   Resp 18   Ht 5\' 9"  (1.753 m)   Wt 56.4 kg   SpO2 96%   BMI 18.36 kg/m   Physical Exam Constitutional:      Comments: Alert.  Nontoxic.  Thin but well-nourished well-developed.  No respiratory distress.  Normal mental status.  HENT:     Head: Normocephalic and atraumatic.  Eyes:     Extraocular Movements: Extraocular movements intact.  Cardiovascular:     Rate and Rhythm: Normal rate and regular rhythm.  Pulmonary:     Effort: Pulmonary effort is normal.     Breath sounds: Normal breath sounds.  Abdominal:     General: There is no distension.     Palpations: Abdomen is soft.     Tenderness: There is no abdominal tenderness. There is no guarding.  Musculoskeletal:        General: Normal range of motion.     Comments: Both legs are symmetric.  No swelling.  No effusions at the knees.  Contours are normal and well-maintained.  Diffuse tenderness to palpation over the bony elements of the right knee.  No erythema.  Calves are soft and nontender and symmetric.  Feet without any edema.  Bilateral dorsalis pedis pulses are 2+ and symmetric.  Skin:    General: Skin is warm and dry.     Coloration: Skin is pale.  Neurological:     General: No focal deficit present.     Mental Status: He is oriented to person, place, and time.     Coordination: Coordination normal.  Psychiatric:        Mood and Affect: Mood normal.     ED Results / Procedures / Treatments   Labs (all labs ordered are listed, but only abnormal results are displayed) Labs Reviewed  CBC WITH DIFFERENTIAL/PLATELET - Abnormal; Notable for the following components:      Result Value   WBC 13.6 (*)    Hemoglobin 10.5 (*)    HCT 31.5 (*)    MCV 70.3 (*)    MCH 23.4 (*)    RDW 22.5 (*)    Platelets 587 (*)    Neutro Abs 11.4 (*)    Abs Immature Granulocytes 0.15 (*)    All other components within normal limits  COMPREHENSIVE METABOLIC PANEL - Abnormal; Notable for  the following components:   Glucose, Bld 119 (*)    Creatinine, Ser 0.57 (*)  Total Bilirubin 1.4 (*)    All other components within normal limits  RETICULOCYTES    EKG None  Radiology No results found.  Procedures Procedures (including critical care time)  Medications Ordered in ED Medications  dextrose 5 %-0.45 % sodium chloride infusion ( Intravenous New Bag/Given 05/18/19 0839)  ketorolac (TORADOL) 15 MG/ML injection 15 mg (15 mg Intravenous Given 05/18/19 0834)  HYDROmorphone (DILAUDID) injection 0.5 mg (0.5 mg Intravenous Given 05/18/19 0836)  HYDROmorphone (DILAUDID) injection 0.5 mg (0.5 mg Intravenous Given 05/18/19 0923)  HYDROmorphone (DILAUDID) injection 0.5 mg (0.5 mg Intravenous Given 05/18/19 1159)    ED Course  I have reviewed the triage vital signs and the nursing notes.  Pertinent labs & imaging results that were available during my care of the patient were reviewed by me and considered in my medical decision making (see chart for details).    MDM Rules/Calculators/A&P                     Findings consistent with sickle cell pain crisis without other complications at this time.  Patient is otherwise been clinically well without fever or signs of infectious etiology.  We will start D5 half-normal saline and pain control with Toradol and Dilaudid.  Patient was treated with Toradol and Dilaudid.  After 3 doses of 0.5 mg Dilaudid patient had significant improvement and resolution of pain.  He is alert and comfortable appearance.  Eating and drinking.  No signs of acute infection.  Patient is afebrile without out other positives on review of systems.  No redness or swelling of the knee joint itself.  Return precautions reviewed.  Patient is instructed to follow-up with his primary sickle-cell caregiver early this week. Final Clinical Impression(s) / ED Diagnoses Final diagnoses:  Sickle cell pain crisis ([place])  Acute pain of left knee    Rx / DC Orders ED  Discharge Orders    None       Charlesetta Shanks, MD 05/18/19 1306

## 2019-05-18 NOTE — Discharge Instructions (Addendum)
1.  Start taking your anti-inflammatory and pain medication when you return home. 2.  Call your sickle cell doctor tomorrow to let them know you had a pain episode and required treatment at the emergency department. 3.  Return to the emergency department if your symptoms are worsening or new symptoms are developing.

## 2019-05-18 NOTE — ED Triage Notes (Signed)
Pt arrives to ED with c/o right knee pain that started around 130 am today. Pt reports that he took his home pain medication (muscle relaxer and oxycodone) around 4 am, reports that it helped some but the pain came back.

## 2019-06-25 ENCOUNTER — Emergency Department (HOSPITAL_BASED_OUTPATIENT_CLINIC_OR_DEPARTMENT_OTHER)
Admission: EM | Admit: 2019-06-25 | Discharge: 2019-06-25 | Disposition: A | Discharge status: Home / Self Care | Payer: BC Managed Care – PPO | Attending: Emergency Medicine | Admitting: Emergency Medicine

## 2019-06-25 ENCOUNTER — Other Ambulatory Visit: Payer: Self-pay

## 2019-06-25 ENCOUNTER — Encounter (HOSPITAL_BASED_OUTPATIENT_CLINIC_OR_DEPARTMENT_OTHER): Payer: Self-pay | Admitting: *Deleted

## 2019-06-25 DIAGNOSIS — Z79899 Other long term (current) drug therapy: Secondary | ICD-10-CM | POA: Diagnosis not present

## 2019-06-25 DIAGNOSIS — J45909 Unspecified asthma, uncomplicated: Secondary | ICD-10-CM | POA: Diagnosis not present

## 2019-06-25 DIAGNOSIS — D5700 Hb-SS disease with crisis, unspecified: Secondary | ICD-10-CM | POA: Diagnosis present

## 2019-06-25 DIAGNOSIS — I10 Essential (primary) hypertension: Secondary | ICD-10-CM | POA: Insufficient documentation

## 2019-06-25 DIAGNOSIS — Z7722 Contact with and (suspected) exposure to environmental tobacco smoke (acute) (chronic): Secondary | ICD-10-CM | POA: Insufficient documentation

## 2019-06-25 LAB — COMPREHENSIVE METABOLIC PANEL
ALT: 19 U/L (ref 0–44)
AST: 24 U/L (ref 15–41)
Albumin: 4.3 g/dL (ref 3.5–5.0)
Alkaline Phosphatase: 87 U/L (ref 38–126)
Anion gap: 8 (ref 5–15)
BUN: 10 mg/dL (ref 6–20)
CO2: 22 mmol/L (ref 22–32)
Calcium: 8.9 mg/dL (ref 8.9–10.3)
Chloride: 103 mmol/L (ref 98–111)
Creatinine, Ser: 0.61 mg/dL (ref 0.61–1.24)
GFR calc Af Amer: >60 mL/min (ref >60)
GFR calc non Af Amer: >60 mL/min (ref >60)
Glucose, Bld: 126 mg/dL — ABNORMAL HIGH (ref 70–99)
Potassium: 3.7 mmol/L (ref 3.5–5.1)
Sodium: 133 mmol/L — ABNORMAL LOW (ref 135–145)
Total Bilirubin: 1.9 mg/dL — ABNORMAL HIGH (ref 0.3–1.2)
Total Protein: 7.9 g/dL (ref 6.5–8.1)

## 2019-06-25 LAB — CBC WITH DIFFERENTIAL/PLATELET
Abs Immature Granulocytes: 0.04 10*3/uL (ref 0.00–0.07)
Basophils Absolute: 0.1 10*3/uL (ref 0.0–0.1)
Basophils Relative: 1 %
Eosinophils Absolute: 0.6 10*3/uL — ABNORMAL HIGH (ref 0.0–0.5)
Eosinophils Relative: 5 %
HCT: 32.5 % — ABNORMAL LOW (ref 39.0–52.0)
Hemoglobin: 11.3 g/dL — ABNORMAL LOW (ref 13.0–17.0)
Immature Granulocytes: 0 %
Lymphocytes Relative: 25 %
Lymphs Abs: 3.2 10*3/uL (ref 0.7–4.0)
MCH: 24.9 pg — ABNORMAL LOW (ref 26.0–34.0)
MCHC: 34.8 g/dL (ref 30.0–36.0)
MCV: 71.6 fL — ABNORMAL LOW (ref 80.0–100.0)
Monocytes Absolute: 1 10*3/uL (ref 0.1–1.0)
Monocytes Relative: 8 %
Neutro Abs: 7.6 10*3/uL (ref 1.7–7.7)
Neutrophils Relative %: 61 %
Platelets: 539 10*3/uL — ABNORMAL HIGH (ref 150–400)
RBC: 4.54 MIL/uL (ref 4.22–5.81)
RDW: 23.9 % — ABNORMAL HIGH (ref 11.5–15.5)
Smear Review: NORMAL
WBC: 12.5 10*3/uL — ABNORMAL HIGH (ref 4.0–10.5)
nRBC: 0.2 % (ref 0.0–0.2)

## 2019-06-25 LAB — RETICULOCYTES
Immature Retic Fract: 23.5 % — ABNORMAL HIGH (ref 2.3–15.9)
RBC.: 4.7 MIL/uL (ref 4.22–5.81)
Retic Count, Absolute: 314.9 10*3/uL — ABNORMAL HIGH (ref 19.0–186.0)
Retic Ct Pct: 6.7 % — ABNORMAL HIGH (ref 0.4–3.1)

## 2019-06-25 MED ORDER — ONDANSETRON HCL 4 MG/2ML IJ SOLN
4.0000 mg | INTRAMUSCULAR | Status: DC | PRN
  Filled 2019-06-25: qty 2

## 2019-06-25 MED ORDER — HYDROMORPHONE HCL 1 MG/ML IJ SOLN: 1.0000 mg | INTRAMUSCULAR | Status: AC

## 2019-06-25 MED ORDER — SODIUM CHLORIDE 0.45 % IV SOLN: INTRAVENOUS | Status: DC

## 2019-06-25 MED ORDER — HYDROMORPHONE HCL 1 MG/ML IJ SOLN
1.0000 mg | Freq: Once | INTRAMUSCULAR | Status: AC
  Administered 2019-06-25: 1 mg via INTRAVENOUS
  Filled 2019-06-25: qty 1

## 2019-06-25 MED ORDER — DIPHENHYDRAMINE HCL 50 MG/ML IJ SOLN
25.0000 mg | Freq: Once | INTRAMUSCULAR | Status: AC
  Administered 2019-06-25: 25 mg via INTRAVENOUS
  Filled 2019-06-25: qty 1

## 2019-06-25 MED ORDER — HYDROMORPHONE HCL 1 MG/ML IJ SOLN
0.5000 mg | INTRAMUSCULAR | Status: AC
  Administered 2019-06-25: 0.5 mg via INTRAVENOUS
  Filled 2019-06-25: qty 1

## 2019-06-25 MED ORDER — KETOROLAC TROMETHAMINE 15 MG/ML IJ SOLN
15.0000 mg | INTRAMUSCULAR | Status: AC
  Administered 2019-06-25: 15 mg via INTRAVENOUS
  Filled 2019-06-25: qty 1

## 2019-06-25 NOTE — Discharge Instructions (Signed)
Take your home pain medicine.  Return for any new or worsening symptoms

## 2019-06-25 NOTE — ED Provider Notes (Signed)
[facility] EMERGENCY DEPARTMENT Provider Note   CSN: [id number] Arrival date & time: 06/25/19  1805    History Chief Complaint  Patient presents with  . Sickle Cell Pain Crisis    Mark Blake is a 19 y.o. male with past history significant for asthma, avascular necrosis, hypertension, sickle cell who presents for evaluation of sickle cell pain.  Patient states he has pain to his right upper arm.  Denies any recent injury or trauma.  States this feels typical of his sickle cell pain.  Has taken ibuprofen as well as OxyContin at home without relief.  He rates his pain a 7/10.  Denies redness, swelling, warmth or paresthesias.  No fever, chills, nausea, vomiting, chest pain, shortness of breath abdominal pain, diarrhea, dysuria.  Denies aggravating or alleviating factors.  History obtained from patient and past medical records.  No interpreter used.  HPI     Past Medical History:  Diagnosis Date  . Asthma   . Avascular necrosis of hip (HCC)   . Hypertension   . Sickle cell anemia [facility])     Patient Active Problem List   Diagnosis Date Noted  . Moderate persistent asthma 08/09/2016  . Seasonal and perennial allergic rhinitis 08/09/2016  . Allergic conjunctivitis 08/09/2016  . Atopic dermatitis 08/09/2016    Past Surgical History:  Procedure Laterality Date  . HIP SURGERY    . SPLENECTOMY         Family History  Problem Relation Age of Onset  . Asthma Mother   . Eczema Mother   . Lupus Mother   . Migraines Mother   . Asthma Father   . Eczema Father   . Asthma Brother   . Allergic rhinitis Neg Hx   . Urticaria Neg Hx   . Immunodeficiency Neg Hx   . Atopy Neg Hx   . Angioedema Neg Hx     Social History   Tobacco Use  . Smoking status: Passive Smoke Exposure - Never Smoker  . Smokeless tobacco: Never Used  Substance Use Topics  . Alcohol use: No  . Drug use: No    Home Medications Prior to Admission medications   Medication Sig Start Date  End Date Taking? Authorizing Provider  oxyCODONE (OXY IR/ROXICODONE) 5 MG immediate release tablet Take 5 mg by mouth every 4 (four) hours as needed for pain.    Yes [provider]  albuterol (VENTOLIN HFA) 108 (90 Base) MCG/ACT inhaler Inhale 1-2 puffs into the lungs every 4 (four) hours as needed for wheezing (coughing spells). 03/31/19   Fletcher Anon, MD  cefadroxil (DURICEF) 500 MG capsule Take 500 mg by mouth 2 (two) times daily.    [provider]  clindamycin (CLEOCIN T) 1 % external solution Apply topically 2 (two) times daily.    [provider]  fluticasone (FLONASE) 50 MCG/ACT nasal spray Place 2 sprays into both nostrils daily as needed (stuffy nose). 03/31/19   Fletcher Anon, MD  hydroxyurea (HYDREA) 500 MG capsule Take 500 mg by mouth daily. May take with food to minimize GI side effects.    [provider]  ibuprofen (ADVIL,MOTRIN) 400 MG tablet Take 400 mg by mouth every 6 (six) hours as needed.    [provider]  levocetirizine (XYZAL) 5 MG tablet Take 1 tablet (5 mg total) by mouth daily. For runny nose or itchy eyes 03/31/19   Fletcher Anon, MD  loratadine (CLARITIN) 10 MG tablet Take 10 mg by mouth daily.  [provider]  montelukast (SINGULAIR) 10 MG tablet Take 1 tablet (10 mg total) by mouth at bedtime. To prevent coughing or wheezing. 03/31/19   Fletcher Anon, MD  Olopatadine HCl (PATADAY) 0.2 % SOLN Place 1 drop into both eyes daily as needed (itchy eyes). 03/31/19   Fletcher Anon, MD    Allergies    Pork allergy  Review of Systems   Review of Systems  Constitutional: Negative.   HENT: Negative.   Respiratory: Negative.   Cardiovascular: Negative.   Gastrointestinal: Negative.   Genitourinary: Negative.   Musculoskeletal:       Left arm pain  Skin: Negative.   Neurological: Negative.   All other systems reviewed and are negative.   Physical Exam Updated Vital Signs BP (!) 145/79   Pulse  88   Temp 98.1 F (36.7 C) (Oral)   Resp 15   Ht 5\' 9"  (1.753 m)   Wt 56.2 kg   SpO2 97%   BMI 18.31 kg/m   Physical Exam Vitals and nursing note reviewed.  Constitutional:      General: He is not in acute distress.    Appearance: He is well-developed. He is not ill-appearing, toxic-appearing or diaphoretic.  HENT:     Head: Normocephalic and atraumatic.     Nose: Nose normal.     Mouth/Throat:     Mouth: Mucous membranes are moist.     Pharynx: Oropharynx is clear.  Eyes:     Pupils: Pupils are equal, round, and reactive to light.  Cardiovascular:     Rate and Rhythm: Normal rate and regular rhythm.     Pulses: Normal pulses.     Heart sounds: Normal heart sounds.  Pulmonary:     Effort: Pulmonary effort is normal. No respiratory distress.     Breath sounds: Normal breath sounds.  Abdominal:     General: Bowel sounds are normal. There is no distension.     Palpations: Abdomen is soft.     Tenderness: There is no abdominal tenderness. There is no right CVA tenderness, left CVA tenderness, guarding or rebound.  Musculoskeletal:        General: No swelling, tenderness, deformity or signs of injury. Normal range of motion.     Cervical back: Normal range of motion and neck supple.     Right lower leg: No edema.     Left lower leg: No edema.     Comments: Moves all 4 extremities without difficulty.  Compartments soft.  No bony tenderness.  No edema  Skin:    General: Skin is warm and dry.     Capillary Refill: Capillary refill takes less than 2 seconds.     Comments: No edema, erythema or warmth.  No fluctuance or induration.  No rashes or lesions.  Neurological:     General: No focal deficit present.     Mental Status: He is alert and oriented to person, place, and time.     Comments: 5/5 strength bilateral upper extremities without difficulty.  Ambulatory without difficulty.  Intact sensation.    ED Results / Procedures / Treatments   Labs (all labs ordered are  listed, but only abnormal results are displayed) Labs Reviewed  COMPREHENSIVE METABOLIC PANEL - Abnormal; Notable for the following components:      Result Value   Sodium 133 (*)    Glucose, Bld 126 (*)    Total Bilirubin 1.9 (*)    All other components within normal limits  RETICULOCYTES -  Abnormal; Notable for the following components:   Retic Ct Pct 6.7 (*)    Retic Count, Absolute 314.9 (*)    Immature Retic Fract 23.5 (*)    All other components within normal limits  CBC WITH DIFFERENTIAL/PLATELET - Abnormal; Notable for the following components:   WBC 12.5 (*)    Hemoglobin 11.3 (*)    HCT 32.5 (*)    MCV 71.6 (*)    MCH 24.9 (*)    RDW 23.9 (*)    Platelets 539 (*)    Eosinophils Absolute 0.6 (*)    All other components within normal limits    EKG None  Radiology No results found.  Procedures Procedures (including critical care time)  Medications Ordered in ED Medications  HYDROmorphone (DILAUDID) injection 1 mg ( Intravenous Canceled Entry 06/25/19 2238)  ondansetron (ZOFRAN) injection 4 mg (has no administration in time range)  0.45 % sodium chloride infusion ( Intravenous Stopped 06/25/19 2238)  HYDROmorphone (DILAUDID) injection 0.5 mg (0.5 mg Intravenous Given 06/25/19 1906)  ketorolac (TORADOL) 15 MG/ML injection 15 mg (15 mg Intravenous Given 06/25/19 1905)  diphenhydrAMINE (BENADRYL) injection 25 mg (25 mg Intravenous Given 06/25/19 1903)  HYDROmorphone (DILAUDID) injection 1 mg (1 mg Intravenous Given 06/25/19 2205)    ED Course  I have reviewed the triage vital signs and the nursing notes.  Pertinent labs & imaging results that were available during my care of the patient were reviewed by me and considered in my medical decision making (see chart for details).  19 year old male history of sickle cell anemia presents for evaluation of pain typical of his sickle cell crisis.  Has been taking OxyContin at home without relief of his pain.  Pain located to right  upper arm.  No recent injury or trauma.  No edema, erythema or warmth.  No bony tenderness.  Compartments soft.  Denies any chest pain or shortness of breath.  He is afebrile without tachycardia, tachypnea or hypoxia.  Plan on labs, pain management and reassess.  I have low suspicion for septic joint, gout, hemarthrosis, compartment syndrome, VTE, vascular emergency.  Labs and imaging personally interpreted: CBC with leukocytosis at 12.5, globin 11.3 CMP with mild hyponatremia 133, hyperglycemia to 126, total bili 1.9.  Abdomen soft with negative Murphy sign.  Suspicion for cholecystitis, CBD obstructing stone Reticulocytes pending  2100: Patient reassessed.  Pain improved however requesting second dose of pain medicine.  He feels like he may be able to go home after second dose of pain medicine  Patient reassessed. Pain controlled after 2 doses of IV medication. Feels improved to dc home. Will have him follow up with PCP outpatient.  The patient has been appropriately medically screened and/or stabilized in the ED. I have low suspicion for any other emergent medical condition which would require further screening, evaluation or treatment in the ED or require inpatient management.  Patient is hemodynamically stable and in no acute distress.  Patient able to ambulate in department prior to ED.  Evaluation does not show acute pathology that would require ongoing or additional emergent interventions while in the emergency department or further inpatient treatment.  I have discussed the diagnosis with the patient and answered all questions.  Pain is been managed while in the emergency department and patient has no further complaints prior to discharge.  Patient is comfortable with plan discussed in room and is stable for discharge at this time.  I have discussed strict return precautions for returning to the emergency department.  Patient was encouraged to follow-up with PCP/specialist refer to at  discharge.      MDM Rules/Calculators/A&P                      Final Clinical Impression(s) / ED Diagnoses Final diagnoses:  Sickle cell anemia with pain [facility])    Rx / DC Orders ED Discharge Orders    None       ,  A, PA-C 06/25/19 2325    Veryl Speak, MD 06/26/19 2319

## 2019-06-25 NOTE — ED Triage Notes (Signed)
C/o left arm pain x 11 hrs

## 2019-09-15 ENCOUNTER — Other Ambulatory Visit: Payer: Self-pay

## 2019-09-15 ENCOUNTER — Emergency Department (HOSPITAL_BASED_OUTPATIENT_CLINIC_OR_DEPARTMENT_OTHER)
Admission: EM | Admit: 2019-09-15 | Discharge: 2019-09-15 | Disposition: A | Discharge status: Home / Self Care | Payer: BC Managed Care – PPO | Attending: Emergency Medicine | Admitting: Emergency Medicine

## 2019-09-15 ENCOUNTER — Encounter (HOSPITAL_BASED_OUTPATIENT_CLINIC_OR_DEPARTMENT_OTHER): Payer: Self-pay | Admitting: Emergency Medicine

## 2019-09-15 DIAGNOSIS — M25562 Pain in left knee: Secondary | ICD-10-CM | POA: Insufficient documentation

## 2019-09-15 DIAGNOSIS — Z5321 Procedure and treatment not carried out due to patient leaving prior to being seen by health care provider: Secondary | ICD-10-CM | POA: Diagnosis not present

## 2019-09-15 NOTE — ED Triage Notes (Signed)
Left knee pain x 48 hours.  Has Sickle Cell disease and Avascular necrosis.  Sts when this happens he needs fluids and possibly pain meds.  Today's pain is not as bad as sometimes.

## 2019-10-22 ENCOUNTER — Other Ambulatory Visit: Payer: Self-pay

## 2019-10-22 ENCOUNTER — Encounter (HOSPITAL_BASED_OUTPATIENT_CLINIC_OR_DEPARTMENT_OTHER): Payer: Self-pay

## 2019-10-22 ENCOUNTER — Emergency Department (HOSPITAL_BASED_OUTPATIENT_CLINIC_OR_DEPARTMENT_OTHER)
Admission: EM | Admit: 2019-10-22 | Discharge: 2019-10-22 | Disposition: A | Discharge status: Home / Self Care | Payer: BC Managed Care – PPO | Attending: Emergency Medicine | Admitting: Emergency Medicine

## 2019-10-22 DIAGNOSIS — Z5321 Procedure and treatment not carried out due to patient leaving prior to being seen by health care provider: Secondary | ICD-10-CM | POA: Insufficient documentation

## 2019-10-22 DIAGNOSIS — M25511 Pain in right shoulder: Secondary | ICD-10-CM | POA: Insufficient documentation

## 2019-10-22 LAB — CBC WITH DIFFERENTIAL/PLATELET
Abs Immature Granulocytes: 0.11 10*3/uL — ABNORMAL HIGH (ref 0.00–0.07)
Basophils Absolute: 0.1 10*3/uL (ref 0.0–0.1)
Basophils Relative: 1 %
Eosinophils Absolute: 0.7 10*3/uL — ABNORMAL HIGH (ref 0.0–0.5)
Eosinophils Relative: 6 %
HCT: 32.3 % — ABNORMAL LOW (ref 39.0–52.0)
Hemoglobin: 11 g/dL — ABNORMAL LOW (ref 13.0–17.0)
Immature Granulocytes: 1 %
Lymphocytes Relative: 24 %
Lymphs Abs: 2.9 10*3/uL (ref 0.7–4.0)
MCH: 24.9 pg — ABNORMAL LOW (ref 26.0–34.0)
MCHC: 34.1 g/dL (ref 30.0–36.0)
MCV: 73.1 fL — ABNORMAL LOW (ref 80.0–100.0)
Monocytes Absolute: 0.9 10*3/uL (ref 0.1–1.0)
Monocytes Relative: 8 %
Neutro Abs: 7.1 10*3/uL (ref 1.7–7.7)
Neutrophils Relative %: 60 %
Platelets: 504 10*3/uL — ABNORMAL HIGH (ref 150–400)
RBC: 4.42 MIL/uL (ref 4.22–5.81)
RDW: 23.9 % — ABNORMAL HIGH (ref 11.5–15.5)
WBC: 11.8 10*3/uL — ABNORMAL HIGH (ref 4.0–10.5)
nRBC: 0.3 % — ABNORMAL HIGH (ref 0.0–0.2)

## 2019-10-22 LAB — COMPREHENSIVE METABOLIC PANEL
ALT: 36 U/L (ref 0–44)
AST: 34 U/L (ref 15–41)
Albumin: 4.3 g/dL (ref 3.5–5.0)
Alkaline Phosphatase: 90 U/L (ref 38–126)
Anion gap: 11 (ref 5–15)
BUN: 9 mg/dL (ref 6–20)
CO2: 25 mmol/L (ref 22–32)
Calcium: 8.7 mg/dL — ABNORMAL LOW (ref 8.9–10.3)
Chloride: 102 mmol/L (ref 98–111)
Creatinine, Ser: 0.62 mg/dL (ref 0.61–1.24)
GFR calc Af Amer: >60 mL/min (ref >60)
GFR calc non Af Amer: >60 mL/min (ref >60)
Glucose, Bld: 138 mg/dL — ABNORMAL HIGH (ref 70–99)
Potassium: 4.3 mmol/L (ref 3.5–5.1)
Sodium: 138 mmol/L (ref 135–145)
Total Bilirubin: 2.1 mg/dL — ABNORMAL HIGH (ref 0.3–1.2)
Total Protein: 7.5 g/dL (ref 6.5–8.1)

## 2019-10-22 LAB — RETICULOCYTES
Immature Retic Fract: 48 % — ABNORMAL HIGH (ref 2.3–15.9)
RBC.: 4.36 MIL/uL (ref 4.22–5.81)
Retic Count, Absolute: 218.4 10*3/uL — ABNORMAL HIGH (ref 19.0–186.0)
Retic Ct Pct: 5.2 % — ABNORMAL HIGH (ref 0.4–3.1)

## 2019-10-22 MED ORDER — SODIUM CHLORIDE 0.9% FLUSH
3.0000 mL | Freq: Once | INTRAVENOUS | Status: DC
  Filled 2019-10-22: qty 3

## 2019-10-22 NOTE — ED Triage Notes (Signed)
Pt c/o SSC pain to right shoulder started 10am-NAD-steady gait

## 2020-04-19 ENCOUNTER — Emergency Department (INDEPENDENT_AMBULATORY_CARE_PROVIDER_SITE_OTHER)
Admission: RE | Admit: 2020-04-19 | Discharge: 2020-04-19 | Disposition: A | Discharge status: Home / Self Care | Payer: BC Managed Care – PPO | Source: Ambulatory Visit

## 2020-04-19 ENCOUNTER — Other Ambulatory Visit: Payer: Self-pay

## 2020-04-19 ENCOUNTER — Emergency Department (INDEPENDENT_AMBULATORY_CARE_PROVIDER_SITE_OTHER): Payer: BC Managed Care – PPO

## 2020-04-19 VITALS — BP 144/84 | HR 99 | Temp 98.9°F | Resp 16 | Ht 68.0 in | Wt 130.0 lb

## 2020-04-19 DIAGNOSIS — R0781 Pleurodynia: Secondary | ICD-10-CM | POA: Diagnosis not present

## 2020-04-19 DIAGNOSIS — R0789 Other chest pain: Secondary | ICD-10-CM

## 2020-04-19 DIAGNOSIS — R059 Cough, unspecified: Secondary | ICD-10-CM

## 2020-04-19 DIAGNOSIS — J069 Acute upper respiratory infection, unspecified: Secondary | ICD-10-CM

## 2020-04-19 HISTORY — DX: Sickle-cell disease without crisis: D57.1

## 2020-04-19 LAB — POC SARS CORONAVIRUS 2 AG -  ED: SARS Coronavirus 2 Ag: NEGATIVE

## 2020-04-19 MED ORDER — BENZONATATE 100 MG PO CAPS: 100.0000 mg | ORAL_CAPSULE | Freq: Three times a day (TID) | ORAL | 0 refills | Status: AC

## 2020-04-19 NOTE — ED Provider Notes (Signed)
[facility]    CSN: [id number] Arrival date & time: 04/19/20  1008      History   Chief Complaint Chief Complaint  Patient presents with  . Appointment  . Cough    HPI Mark Blake is a 20 y.o. male.   HPI  Mark Blake is a 20 y.o. male presenting to UC with c/o cough, congestion and Left lower chest wall pain/soreness with coughing. Symptoms started 10 days ago. Others at home have been sick with mild URI symptoms as well.  No one has had COVID testing. Pt has a hx of sickle cell anemia and is not vaccinated for COVID or flu.  Denies fever, chills, n/v/d. No SOB.    Past Medical History:  Diagnosis Date  . Sickle cell anemia (HCC)     There are no problems to display for this patient.   Past Surgical History:  Procedure Laterality Date  . HIP SURGERY Bilateral   . SPLENECTOMY, TOTAL         Home Medications    Prior to Admission medications   Medication Sig Start Date End Date Taking? Authorizing Provider  benzonatate (TESSALON) 100 MG capsule Take 1 capsule (100 mg total) by mouth every 8 (eight) hours. 04/19/20  Yes Karolina Zamor O, PA-C  OXBRYTA 500 MG TABS tablet Take by mouth. 03/25/20   [provider]    Family History Family History  Problem Relation Age of Onset  . Lupus Mother   . Sickle cell trait Mother   . Sickle cell anemia Father   . Healthy Sister   . Healthy Brother   . Healthy Brother   . Healthy Brother     Social History Social History   Tobacco Use  . Smoking status: Never Smoker  . Smokeless tobacco: Never Used  Vaping Use  . Vaping Use: Never used  Substance Use Topics  . Alcohol use: Not Currently  . Drug use: Never     Allergies   Pork-derived products   Review of Systems Review of Systems  Constitutional: Negative for chills and fever.  HENT: Positive for congestion. Negative for ear pain, sore throat, trouble swallowing and voice change.   Respiratory: Positive for cough. Negative for  chest tightness and shortness of breath.   Cardiovascular: Positive for chest pain (left lower). Negative for palpitations.  Gastrointestinal: Negative for abdominal pain, diarrhea, nausea and vomiting.  Musculoskeletal: Negative for arthralgias, back pain and myalgias.  Skin: Negative for rash.  All other systems reviewed and are negative.    Physical Exam Triage Vital Signs ED Triage Vitals  Enc Vitals Group     BP 04/19/20 1029 (!) 144/84     Pulse Rate 04/19/20 1029 99     Resp 04/19/20 1029 16     Temp 04/19/20 1029 98.9 F (37.2 C)     Temp Source 04/19/20 1029 Oral     SpO2 04/19/20 1029 97 %     Weight 04/19/20 1032 130 lb (59 kg)     Height 04/19/20 1032 5\' 8"  (1.727 m)     Head Circumference --      Peak Flow --      Pain Score 04/19/20 1032 0     Pain Loc --      Pain Edu? --      Excl. in GC? --    No data found.  Updated Vital Signs BP (!) 144/84 (BP Location: Right Arm)   Pulse 99   Temp 98.9  F (37.2 C) (Oral)   Resp 16   Ht 5\' 8"  (1.727 m)   Wt 130 lb (59 kg)   SpO2 97%   BMI 19.77 kg/m   Visual Acuity Right Eye Distance:   Left Eye Distance:   Bilateral Distance:    Right Eye Near:   Left Eye Near:    Bilateral Near:     Physical Exam Vitals and nursing note reviewed.  Constitutional:      General: He is not in acute distress.    Appearance: Normal appearance. He is well-developed and well-nourished. He is not ill-appearing, toxic-appearing or diaphoretic.  HENT:     Head: Normocephalic and atraumatic.     Right Ear: Tympanic membrane and ear canal normal.     Left Ear: Tympanic membrane and ear canal normal.     Nose: Nose normal.     Right Sinus: No maxillary sinus tenderness or frontal sinus tenderness.     Left Sinus: No maxillary sinus tenderness or frontal sinus tenderness.     Mouth/Throat:     Lips: Pink.     Mouth: Mucous membranes are moist.     Pharynx: Oropharynx is clear. Uvula midline. No pharyngeal swelling,  oropharyngeal exudate, posterior oropharyngeal erythema or uvula swelling.  Eyes:     Extraocular Movements: EOM normal.  Cardiovascular:     Rate and Rhythm: Normal rate and regular rhythm.  Pulmonary:     Effort: Pulmonary effort is normal. No respiratory distress.     Breath sounds: Normal breath sounds. No stridor. No wheezing, rhonchi or rales.    Chest:     Chest wall: Tenderness present.    Musculoskeletal:        General: Normal range of motion.     Cervical back: Normal range of motion.  Skin:    General: Skin is warm and dry.  Neurological:     Mental Status: He is alert and oriented to person, place, and time.  Psychiatric:        Mood and Affect: Mood and affect normal.        Behavior: Behavior normal.      UC Treatments / Results  Labs (all labs ordered are listed, but only abnormal results are displayed) Labs Reviewed  SARS-COV-2 RNA,(COVID-19) QUALITATIVE NAAT  POC SARS CORONAVIRUS 2 AG -  ED    EKG   Radiology DG Chest 2 View  Result Date: 04/19/2020 CLINICAL DATA:  Cough, congestion, left lower rib pain. EXAM: CHEST - 2 VIEW COMPARISON:  January 18, 2014. FINDINGS: The heart size and mediastinal contours are within normal limits. Both lungs are clear. No visible pleural effusions or pneumothorax. No acute osseous abnormality. H-shaped vertebral bodies, compatible with the sequela of sickle cell disease. IMPRESSION: No active cardiopulmonary disease. Electronically Signed   By: January 20, 2014 MD   On: 04/19/2020 11:18    Procedures Procedures (including critical care time)  Medications Ordered in UC Medications - No data to display  Initial Impression / Assessment and Plan / UC Course  I have reviewed the triage vital signs and the nursing notes.  Pertinent labs & imaging results that were available during my care of the patient were reviewed by me and considered in my medical decision making (see chart for details).    Rapid COVID:  negative COVID PCR: pending Discussed CXR with pt Encouraged symptomatic tx, exam c/w musculoskeletal pain F/u with PCP as needed Discussed symptoms that warrant emergent care in the ED.  Final Clinical Impressions(s) / UC Diagnoses   Final diagnoses:  Acute upper respiratory infection  Viral URI with cough  Left-sided chest wall pain     Discharge Instructions      You may take 500mg  acetaminophen every 4-6 hours or in combination with ibuprofen 400-600mg  every 6-8 hours as needed for pain, inflammation, and fever. You may also alternate a cool and warm compress to help with pain.   Be sure to well hydrated with clear liquids and get at least 8 hours of sleep at night, preferably more while sick.   Please follow up with family medicine in 1 week if needed.     ED Prescriptions    Medication Sig Dispense Auth. Provider   benzonatate (TESSALON) 100 MG capsule Take 1 capsule (100 mg total) by mouth every 8 (eight) hours. 21 capsule , Lurene Shadow     PDMP not reviewed this encounter.   [place], Lurene Shadow 04/19/20 1215

## 2020-04-19 NOTE — ED Triage Notes (Signed)
Possible exposure to COVID w/in the last 2 weeks - family members are sick at home  No one has been tested  Cough x 10  Days Denies fever or sore throat  NO COVID or Flu vaccine  OTC Nyquil & tylenol - none today

## 2020-04-19 NOTE — ED Provider Notes (Incomplete)
[facility]    CSN: [id number] Arrival date & time: 04/19/20  1008      History   Chief Complaint Chief Complaint  Patient presents with  . Appointment  . Cough    HPI Zyrus Hetland is a 20 y.o. male.   HPI  Past Medical History:  Diagnosis Date  . Sickle cell anemia (HCC)     There are no problems to display for this patient.   Past Surgical History:  Procedure Laterality Date  . HIP SURGERY Bilateral   . SPLENECTOMY, TOTAL         Home Medications    Prior to Admission medications   Medication Sig Start Date End Date Taking? Authorizing Provider  OXBRYTA 500 MG TABS tablet Take by mouth. 03/25/20   [provider]    Family History Family History  Problem Relation Age of Onset  . Lupus Mother   . Sickle cell trait Mother   . Sickle cell anemia Father   . Healthy Sister   . Healthy Brother   . Healthy Brother   . Healthy Brother     Social History Social History   Tobacco Use  . Smoking status: Never Smoker  . Smokeless tobacco: Never Used  Vaping Use  . Vaping Use: Never used  Substance Use Topics  . Alcohol use: Not Currently  . Drug use: Never     Allergies   Pork-derived products   Review of Systems Review of Systems   Physical Exam Triage Vital Signs ED Triage Vitals  Enc Vitals Group     BP 04/19/20 1029 (!) 144/84     Pulse Rate 04/19/20 1029 99     Resp 04/19/20 1029 16     Temp 04/19/20 1029 98.9 F (37.2 C)     Temp Source 04/19/20 1029 Oral     SpO2 04/19/20 1029 97 %     Weight 04/19/20 1032 130 lb (59 kg)     Height 04/19/20 1032 5\' 8"  (1.727 m)     Head Circumference --      Peak Flow --      Pain Score 04/19/20 1032 0     Pain Loc --      Pain Edu? --      Excl. in GC? --    No data found.  Updated Vital Signs BP (!) 144/84 (BP Location: Right Arm)   Pulse 99   Temp 98.9 F (37.2 C) (Oral)   Resp 16   Ht 5\' 8"  (1.727 m)   Wt 130 lb (59 kg)   SpO2 97%   BMI 19.77 kg/m    Visual Acuity Right Eye Distance:   Left Eye Distance:   Bilateral Distance:    Right Eye Near:   Left Eye Near:    Bilateral Near:     Physical Exam   UC Treatments / Results  Labs (all labs ordered are listed, but only abnormal results are displayed) Labs Reviewed - No data to display  EKG   Radiology No results found.  Procedures Procedures (including critical care time)  Medications Ordered in UC Medications - No data to display  Initial Impression / Assessment and Plan / UC Course  I have reviewed the triage vital signs and the nursing notes.  Pertinent labs & imaging results that were available during my care of the patient were reviewed by me and considered in my medical decision making (see chart for details).     *** Final  Clinical Impressions(s) / UC Diagnoses   Final diagnoses:  Acute upper respiratory infection   Discharge Instructions   None    ED Prescriptions    None     PDMP not reviewed this encounter.

## 2020-04-19 NOTE — Discharge Instructions (Signed)
  You may take 500mg  acetaminophen every 4-6 hours or in combination with ibuprofen 400-600mg  every 6-8 hours as needed for pain, inflammation, and fever. You may also alternate a cool and warm compress to help with pain.   Be sure to well hydrated with clear liquids and get at least 8 hours of sleep at night, preferably more while sick.   Please follow up with family medicine in 1 week if needed.

## 2020-04-20 ENCOUNTER — Encounter (HOSPITAL_BASED_OUTPATIENT_CLINIC_OR_DEPARTMENT_OTHER): Payer: Self-pay

## 2020-04-21 LAB — SARS-COV-2 RNA,(COVID-19) QUALITATIVE NAAT: SARS CoV2 RNA: NOT DETECTED

## 2020-08-01 ENCOUNTER — Other Ambulatory Visit: Payer: Self-pay

## 2020-08-01 ENCOUNTER — Encounter (HOSPITAL_BASED_OUTPATIENT_CLINIC_OR_DEPARTMENT_OTHER): Payer: Self-pay | Admitting: *Deleted

## 2020-08-01 ENCOUNTER — Emergency Department (HOSPITAL_BASED_OUTPATIENT_CLINIC_OR_DEPARTMENT_OTHER)
Admission: EM | Admit: 2020-08-01 | Discharge: 2020-08-01 | Disposition: A | Discharge status: Home / Self Care | Payer: BC Managed Care – PPO | Attending: Emergency Medicine | Admitting: Emergency Medicine

## 2020-08-01 ENCOUNTER — Emergency Department (HOSPITAL_BASED_OUTPATIENT_CLINIC_OR_DEPARTMENT_OTHER): Payer: BC Managed Care – PPO

## 2020-08-01 DIAGNOSIS — M25512 Pain in left shoulder: Secondary | ICD-10-CM | POA: Diagnosis not present

## 2020-08-01 DIAGNOSIS — I10 Essential (primary) hypertension: Secondary | ICD-10-CM | POA: Insufficient documentation

## 2020-08-01 DIAGNOSIS — J4540 Moderate persistent asthma, uncomplicated: Secondary | ICD-10-CM | POA: Diagnosis not present

## 2020-08-01 MED ORDER — KETOROLAC TROMETHAMINE 30 MG/ML IJ SOLN
30.0000 mg | Freq: Once | INTRAMUSCULAR | Status: AC
Start: 2020-08-01 — End: 2020-08-01
  Administered 2020-08-01: 30 mg via INTRAMUSCULAR
  Filled 2020-08-01: qty 1

## 2020-08-01 MED ORDER — HYDROMORPHONE HCL 1 MG/ML IJ SOLN
1.0000 mg | Freq: Once | INTRAMUSCULAR | Status: AC
  Administered 2020-08-01: 1 mg via INTRAMUSCULAR
  Filled 2020-08-01: qty 1

## 2020-08-01 NOTE — ED Provider Notes (Signed)
[facility] EMERGENCY DEPARTMENT Provider Note   CSN: [id number] Arrival date & time: 08/01/20  3532     History Chief Complaint  Patient presents with  . Left Shoulder Pain    Mark Blake is a 20 y.o. male.  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  Patient states that he woke up at 530 this morning with pain in his left shoulder.  He took Tylenol and oxycodone 10 mg at onset of pain without relief.  He denies any joint swelling, skin changes, other joint pains, chest pain, shortness of breath, or fevers.  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  He has history of bilateral hip surgery due to avascular necrosis.  He denies any recent change in physical activity or any trauma.  The history is provided by the patient.       Past Medical History:  Diagnosis Date  . Asthma   . Avascular necrosis of hip (HCC)   . Hypertension   . Sickle cell anemia [facility])     Patient Active Problem List   Diagnosis Date Noted  . Moderate persistent asthma 08/09/2016  . Seasonal and perennial allergic rhinitis 08/09/2016  . Allergic conjunctivitis 08/09/2016  . Atopic dermatitis 08/09/2016    Past Surgical History:  Procedure Laterality Date  . HIP SURGERY    . HIP SURGERY Bilateral   . SPLENECTOMY    . SPLENECTOMY, TOTAL         Family History  Problem Relation Age of Onset  . Lupus Mother   . Sickle cell trait Mother   . Sickle cell anemia Father   . Healthy Sister   . Healthy Brother   . Healthy Brother   . Healthy Brother   . Asthma Mother   . Eczema Mother   . Migraines Mother   . Asthma Father   . Eczema Father   . Asthma Brother   . Allergic rhinitis Neg Hx   . Urticaria Neg Hx   . Immunodeficiency Neg Hx   . Atopy Neg Hx   . Angioedema Neg Hx     Social History   Tobacco Use  . Smoking status: Never Smoker  .  Smokeless tobacco: Never Used  Vaping Use  . Vaping Use: Never used  Substance Use Topics  . Alcohol use: Not Currently  . Drug use: Never    Home Medications Prior to Admission medications   Medication Sig Start Date End Date Taking? Authorizing Provider  oxyCODONE (OXY IR/ROXICODONE) 5 MG immediate release tablet Take 5 mg by mouth every 4 (four) hours as needed for pain.    Yes [provider]  albuterol (VENTOLIN HFA) 108 (90 Base) MCG/ACT inhaler Inhale 1-2 puffs into the lungs every 4 (four) hours as needed for wheezing (coughing spells). 03/31/19   Fletcher Anon, MD  benzonatate (TESSALON) 100 MG capsule Take 1 capsule (100 mg total) by mouth every 8 (eight) hours. 04/19/20   Lurene Shadow, PA-C  cefadroxil (DURICEF) 500 MG capsule Take 500 mg by mouth 2 (two) times daily.    [provider]  clindamycin (CLEOCIN T) 1 % external solution Apply topically 2 (two) times daily.    [provider]  fluticasone (FLONASE) 50 MCG/ACT nasal spray Place 2 sprays into both nostrils daily as needed (stuffy nose). 03/31/19   Fletcher Anon, MD  hydroxyurea (HYDREA) 500 MG capsule  Take 500 mg by mouth daily. May take with food to minimize GI side effects.    [provider]  ibuprofen (ADVIL,MOTRIN) 400 MG tablet Take 400 mg by mouth every 6 (six) hours as needed.    [provider]  levocetirizine (XYZAL) 5 MG tablet Take 1 tablet (5 mg total) by mouth daily. For runny nose or itchy eyes 03/31/19   Fletcher Anon, MD  loratadine (CLARITIN) 10 MG tablet Take 10 mg by mouth daily.    [provider]  montelukast (SINGULAIR) 10 MG tablet Take 1 tablet (10 mg total) by mouth at bedtime. To prevent coughing or wheezing. 03/31/19   Fletcher Anon, MD  Olopatadine HCl (PATADAY) 0.2 % SOLN Place 1 drop into both eyes daily as needed (itchy eyes). 03/31/19   Bardelas, Bonnita [place], MD  OXBRYTA 500 MG TABS tablet Take by mouth. 03/25/20   [provider]    Allergies    Pork allergy and Pork-derived products  Review of Systems   Review of Systems All other systems reviewed and are negative except that which was mentioned in HPI  Physical Exam Updated Vital Signs BP 136/71 (BP Location: Right Arm)   Pulse 85   Temp 98.1 F (36.7 C) (Oral)   Resp 16   Ht 5\' 9"  (1.753 m)   Wt 59 kg   SpO2 93%   BMI 19.20 kg/m   Physical Exam Vitals and nursing note reviewed.  Constitutional:      General: He is not in acute distress.    Appearance: He is well-developed.  HENT:     Head: Normocephalic and atraumatic.  Eyes:     Conjunctiva/sclera: Conjunctivae normal.  Cardiovascular:     Pulses: Normal pulses.  Musculoskeletal:        General: No swelling or deformity. Normal range of motion.     Cervical back: Neck supple.     Comments: Full ROM L shoulder, normal ROM at elbow and wrist, no obvious joint effusion, no warmth or skin changes; 2+ radial pulse  Skin:    General: Skin is warm and dry.     Findings: No erythema or rash.  Neurological:     Mental Status: He is alert and oriented to person, place, and time.     Sensory: No sensory deficit.  Psychiatric:        Judgment: Judgment normal.     ED Results / Procedures / Treatments   Labs (all labs ordered are listed, but only abnormal results are displayed) Labs Reviewed - No data to display  EKG None  Radiology DG Shoulder Left  Result Date: 08/01/2020 CLINICAL DATA:  Left shoulder pain History of sickle cell and avascular necrosis EXAM: LEFT SHOULDER - 2+ VIEW COMPARISON:  12/04/2017 FINDINGS: There is no evidence of fracture or dislocation. There is no evidence of arthropathy or other focal bone abnormality. Soft tissues are unremarkable. IMPRESSION: No acute abnormality of the left shoulder. Electronically Signed   By: 12/06/2017 M.D.   On: 08/01/2020 08:23    Procedures Procedures   Medications Ordered in ED Medications  HYDROmorphone (DILAUDID)  injection 1 mg (1 mg Intramuscular Given 08/01/20 0849)  ketorolac (TORADOL) 30 MG/ML injection 30 mg (30 mg Intramuscular Given 08/01/20 0848)    ED Course  I have reviewed the triage vital signs and the nursing notes.  Pertinent labs & imaging results that were available during my care of the patient were reviewed by me and considered  in my medical decision making (see chart for details).    MDM Rules/Calculators/A&P                          Full range of motion of shoulder demonstrated on exam, neurovascularly intact distally.  No joint swelling or other signs/symptoms to suggest septic joint.  Well-appearing and denying any other complaints on exam to suggest cardiac process or sickle cell pain crisis.  X-ray is negative for acute process.  I have discussed supportive measures and recommended that he follow-up with his orthopedic clinic for reassessment.  Extensively reviewed return precautions regarding signs of infection or systemic illness.  Patient voiced understanding. Final Clinical Impression(s) / ED Diagnoses Final diagnoses:  Acute pain of left shoulder    Rx / DC Orders ED Discharge Orders    None       Ameyah Bangura, Ambrose [place], MD 08/01/20 [phone]

## 2020-08-01 NOTE — ED Triage Notes (Signed)
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 Awoke with left shoulder pain. Took Tylenol and Oxycodone 10mg  PO at onset of pain

## 2020-08-01 NOTE — ED Notes (Signed)
AVS provided to client, reinforced recommendation by EDP to follow up and make an appt with Ortho for further eval of left shoulder pain. Copy of AVS given to client. Opportunity for questions provided

## 2020-08-01 NOTE — ED Notes (Signed)
Pain in left shoulder that radiates to left bicep, strong left radial and brachial pulse noted, strong left hand grip noted, good capillary refill, skin warm to touch, color WNL.

## 2021-03-25 ENCOUNTER — Emergency Department (INDEPENDENT_AMBULATORY_CARE_PROVIDER_SITE_OTHER): Payer: BC Managed Care – PPO

## 2021-03-25 ENCOUNTER — Other Ambulatory Visit: Payer: Self-pay

## 2021-03-25 ENCOUNTER — Emergency Department
Admission: EM | Admit: 2021-03-25 | Discharge: 2021-03-25 | Disposition: A | Discharge status: Home / Self Care | Payer: BC Managed Care – PPO | Attending: Family Medicine | Admitting: Family Medicine

## 2021-03-25 DIAGNOSIS — R0781 Pleurodynia: Secondary | ICD-10-CM

## 2021-03-25 DIAGNOSIS — S20212A Contusion of left front wall of thorax, initial encounter: Secondary | ICD-10-CM

## 2021-03-25 DIAGNOSIS — S161XXA Strain of muscle, fascia and tendon at neck level, initial encounter: Secondary | ICD-10-CM

## 2021-03-25 DIAGNOSIS — M542 Cervicalgia: Secondary | ICD-10-CM | POA: Diagnosis not present

## 2021-03-25 MED ORDER — CYCLOBENZAPRINE HCL 10 MG PO TABS: 10.0000 mg | ORAL_TABLET | Freq: Every day | ORAL | 0 refills | Status: AC

## 2021-03-25 NOTE — ED Triage Notes (Signed)
Pt states that he was in [facility] 1/5.  Pt states that he injured his left rib and has some neck stiffness. X1 day

## 2021-03-25 NOTE — Discharge Instructions (Signed)
Apply ice pack to painful areas for 20 to 30 minutes, 3 to 4 times daily  Continue until pain decreases.  May take Ibuprofen 200mg , 3 to 4 tabs every 8 hours with food.  Begin neck range of motion and stretching exercises as tolerated.
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
